# Patient Record
Sex: Female | Born: 1965 | Hispanic: No | Marital: Single | Smoking: Current every day smoker
Health system: Southern US, Community
[De-identification: ages and names within clinical notes are randomized; demographics above are authoritative.]

## PROBLEM LIST (undated history)

## (undated) DIAGNOSIS — J449 Chronic obstructive pulmonary disease, unspecified: Secondary | ICD-10-CM

## (undated) DIAGNOSIS — M1990 Unspecified osteoarthritis, unspecified site: Secondary | ICD-10-CM

---

## 2006-11-17 ENCOUNTER — Emergency Department (HOSPITAL_COMMUNITY): Admission: EM | Admit: 2006-11-17 | Discharge: 2006-11-17 | Payer: Self-pay | Admitting: Emergency Medicine

## 2014-07-25 ENCOUNTER — Encounter (HOSPITAL_COMMUNITY): Payer: Self-pay | Admitting: Emergency Medicine

## 2014-07-25 ENCOUNTER — Inpatient Hospital Stay (HOSPITAL_COMMUNITY)
Admission: EM | Admit: 2014-07-25 | Discharge: 2014-07-26 | DRG: 193 | Disposition: A | Discharge status: Home / Self Care | Payer: Self-pay | Attending: Internal Medicine | Admitting: Internal Medicine

## 2014-07-25 ENCOUNTER — Emergency Department (HOSPITAL_COMMUNITY): Payer: Self-pay

## 2014-07-25 DIAGNOSIS — Z7951 Long term (current) use of inhaled steroids: Secondary | ICD-10-CM

## 2014-07-25 DIAGNOSIS — J9601 Acute respiratory failure with hypoxia: Secondary | ICD-10-CM | POA: Diagnosis present

## 2014-07-25 DIAGNOSIS — J441 Chronic obstructive pulmonary disease with (acute) exacerbation: Secondary | ICD-10-CM | POA: Diagnosis present

## 2014-07-25 DIAGNOSIS — R0902 Hypoxemia: Secondary | ICD-10-CM

## 2014-07-25 DIAGNOSIS — F17210 Nicotine dependence, cigarettes, uncomplicated: Secondary | ICD-10-CM | POA: Diagnosis present

## 2014-07-25 DIAGNOSIS — J449 Chronic obstructive pulmonary disease, unspecified: Secondary | ICD-10-CM | POA: Diagnosis present

## 2014-07-25 DIAGNOSIS — E876 Hypokalemia: Secondary | ICD-10-CM | POA: Diagnosis present

## 2014-07-25 DIAGNOSIS — D72829 Elevated white blood cell count, unspecified: Secondary | ICD-10-CM | POA: Diagnosis present

## 2014-07-25 DIAGNOSIS — J189 Pneumonia, unspecified organism: Principal | ICD-10-CM | POA: Diagnosis present

## 2014-07-25 HISTORY — DX: Unspecified osteoarthritis, unspecified site: M19.90

## 2014-07-25 HISTORY — DX: Chronic obstructive pulmonary disease, unspecified: J44.9

## 2014-07-25 LAB — CBC
HEMATOCRIT: 38.2 % (ref 36.0–46.0)
Hemoglobin: 11.9 g/dL — ABNORMAL LOW (ref 12.0–15.0)
MCH: 26.2 pg (ref 26.0–34.0)
MCHC: 31.2 g/dL (ref 30.0–36.0)
MCV: 84.1 fL (ref 78.0–100.0)
Platelets: 358 10*3/uL (ref 150–400)
RBC: 4.54 MIL/uL (ref 3.87–5.11)
RDW: 16.4 % — ABNORMAL HIGH (ref 11.5–15.5)
WBC: 13.8 10*3/uL — ABNORMAL HIGH (ref 4.0–10.5)

## 2014-07-25 LAB — BASIC METABOLIC PANEL
Anion gap: 12 (ref 5–15)
BUN: 12 mg/dL (ref 6–23)
CO2: 24 meq/L (ref 19–32)
Calcium: 8.5 mg/dL (ref 8.4–10.5)
Chloride: 103 mEq/L (ref 96–112)
Creatinine, Ser: 0.55 mg/dL (ref 0.50–1.10)
GFR calc Af Amer: >90 mL/min (ref >90)
GFR calc non Af Amer: >90 mL/min (ref >90)
GLUCOSE: 100 mg/dL — AB (ref 70–99)
POTASSIUM: 3.5 meq/L — AB (ref 3.7–5.3)
SODIUM: 139 meq/L (ref 137–147)

## 2014-07-25 LAB — I-STAT TROPONIN, ED: Troponin i, poc: 0 ng/mL (ref 0.00–0.08)

## 2014-07-25 MED ORDER — INFLUENZA VAC SPLIT QUAD 0.5 ML IM SUSY
0.5000 mL | PREFILLED_SYRINGE | INTRAMUSCULAR | Status: AC
  Administered 2014-07-26: 0.5 mL via INTRAMUSCULAR
  Filled 2014-07-25 (×2): qty 0.5

## 2014-07-25 MED ORDER — GUAIFENESIN 100 MG/5ML PO SOLN
200.0000 mg | ORAL | Status: DC | PRN
  Administered 2014-07-25: 200 mg via ORAL
  Filled 2014-07-25: qty 10

## 2014-07-25 MED ORDER — ONDANSETRON HCL 4 MG/2ML IJ SOLN: 4.0000 mg | Freq: Four times a day (QID) | INTRAMUSCULAR | Status: DC | PRN

## 2014-07-25 MED ORDER — PNEUMOCOCCAL VAC POLYVALENT 25 MCG/0.5ML IJ INJ
0.5000 mL | INJECTION | INTRAMUSCULAR | Status: AC
  Administered 2014-07-26: 0.5 mL via INTRAMUSCULAR
  Filled 2014-07-25 (×2): qty 0.5

## 2014-07-25 MED ORDER — PREDNISONE 20 MG PO TABS
60.0000 mg | ORAL_TABLET | Freq: Once | ORAL | Status: AC
  Administered 2014-07-25: 60 mg via ORAL
  Filled 2014-07-25: qty 3

## 2014-07-25 MED ORDER — ONDANSETRON HCL 4 MG PO TABS: 4.0000 mg | ORAL_TABLET | Freq: Four times a day (QID) | ORAL | Status: DC | PRN

## 2014-07-25 MED ORDER — IPRATROPIUM-ALBUTEROL 0.5-2.5 (3) MG/3ML IN SOLN
3.0000 mL | Freq: Once | RESPIRATORY_TRACT | Status: AC
  Administered 2014-07-25: 3 mL via RESPIRATORY_TRACT
  Filled 2014-07-25: qty 3

## 2014-07-25 MED ORDER — SODIUM CHLORIDE 0.9 % IV SOLN
INTRAVENOUS | Status: DC
  Administered 2014-07-25: 21:00:00 via INTRAVENOUS

## 2014-07-25 MED ORDER — LEVOFLOXACIN IN D5W 750 MG/150ML IV SOLN
750.0000 mg | INTRAVENOUS | Status: DC
  Administered 2014-07-25: 750 mg via INTRAVENOUS
  Filled 2014-07-25: qty 150

## 2014-07-25 MED ORDER — TRAMADOL HCL 50 MG PO TABS
100.0000 mg | ORAL_TABLET | Freq: Three times a day (TID) | ORAL | Status: DC | PRN
  Administered 2014-07-26: 100 mg via ORAL
  Filled 2014-07-25: qty 2

## 2014-07-25 MED ORDER — POTASSIUM CHLORIDE CRYS ER 20 MEQ PO TBCR
40.0000 meq | EXTENDED_RELEASE_TABLET | Freq: Once | ORAL | Status: AC
  Administered 2014-07-25: 40 meq via ORAL
  Filled 2014-07-25: qty 2

## 2014-07-25 MED ORDER — IPRATROPIUM-ALBUTEROL 0.5-2.5 (3) MG/3ML IN SOLN: 3.0000 mL | RESPIRATORY_TRACT | Status: DC | PRN

## 2014-07-25 MED ORDER — ACETAMINOPHEN 325 MG PO TABS: 650.0000 mg | ORAL_TABLET | Freq: Four times a day (QID) | ORAL | Status: DC | PRN

## 2014-07-25 MED ORDER — PREDNISONE 50 MG PO TABS
50.0000 mg | ORAL_TABLET | Freq: Every day | ORAL | Status: DC
  Administered 2014-07-26: 50 mg via ORAL
  Filled 2014-07-25 (×2): qty 1

## 2014-07-25 MED ORDER — ALPRAZOLAM 1 MG PO TABS
1.0000 mg | ORAL_TABLET | Freq: Three times a day (TID) | ORAL | Status: DC | PRN
  Administered 2014-07-26: 1 mg via ORAL
  Filled 2014-07-25: qty 1

## 2014-07-25 MED ORDER — ACETAMINOPHEN 650 MG RE SUPP: 650.0000 mg | Freq: Four times a day (QID) | RECTAL | Status: DC | PRN

## 2014-07-25 MED ORDER — IPRATROPIUM-ALBUTEROL 0.5-2.5 (3) MG/3ML IN SOLN
3.0000 mL | RESPIRATORY_TRACT | Status: DC
  Administered 2014-07-25: 3 mL via RESPIRATORY_TRACT
  Filled 2014-07-25 (×2): qty 3

## 2014-07-25 MED ORDER — SODIUM CHLORIDE 0.9 % IJ SOLN
3.0000 mL | Freq: Two times a day (BID) | INTRAMUSCULAR | Status: DC
Start: 2014-07-25 — End: 2014-07-26

## 2014-07-25 NOTE — Progress Notes (Signed)
  CARE MANAGEMENT ED NOTE 07/25/2014  Patient:  [name] Bishop,[name]   Account Number:  [id number]  Date Initiated:  07/25/2014  Documentation initiated by:  Radford PaxFERRERO,Manuella Blackson  Subjective/Objective Assessment:   Patient presents to Ed with shortness of breath, cough with sputum     Subjective/Objective Assessment Detail:   Patient with pmhx of COPD.     Action/Plan:   Action/Plan Detail:   Anticipated DC Date:       Status Recommendation to Physician:   Result of Recommendation:    Other ED Services  Consult Working Plan    DC Planning Services  Other  PCP issues    Choice offered to / List presented to:            Status of service:  Completed, signed off  ED Comments:   ED Comments Detail:  EDCM spoke to patient at bedside.  Patient reports she has just mocved here form [place].  Patient confirms she does nto have a pcp or [place]. EDCM spoke to patient at bedside.  EDCM [facility], informed patient of services there and walk in times.  EDCM also provided patient with list of pcps who accept self pay patients, list of discount pharmacies and websites needymeds.org and GoodRX.com for medication assistance, phone number to inquire about the orange card, phone number to inquire about Medic[employer]ad, phone number to inquire about the Affordable Care Act, financial resources in the community such as local churches, [employer], urban ministries, and dental assistance for uninsured patients.  Patient thankfulf or resources.  No further EDCM needs at this time.

## 2014-07-25 NOTE — ED Provider Notes (Signed)
CSN: [id number]     Arrival date & time 07/25/14  1230 History   First MD Initiated Contact with Patient 07/25/14 1325     Chief Complaint  Patient presents with  . Shortness of Breath  . Medication Refill     (Consider location/radiation/quality/duration/timing/severity/associated sxs/prior Treatment) The history is provided by the patient. No language interpreter was used.  [name] Bishop is a 48 year old female with past medical history of COPD presenting to the ED with shortness of breath that has been ongoing for approximately one month with worsening symptoms of the past couple of days. Stated that the shortness of breath is worse in the morning and stated that she has been having to use her albuterol more frequently. Reported that she's been having increased cough with sputum production of a yellow, greenish discoloration. Reported that she was having fever on Friday, 101F orally. Patient reported that she has not been taking her medications for approximately one month secondary to being unable to afford the medication. Patient reported that she's noticed increased shortness of breath with exertion, reported that she has been unable to walk from one end to the other without feeling short of breath. Stated that she's been experiencing chest pain localized left-sided chest without radiation described as a squeezing sensation intermittently only experience with exertion. Stated that she does smoke approximately a half pack cigarettes per day. Reported that [place] recently [place] from [place] and does not have a primary care provider [place] in [place]. Denied neck pain, neck stiffness, abdominal pain, nausea, vomiting with diarrhea, syncope and blurred vision, sudden loss of vision, numbness, tingling, weakness. PCP none   Past Medical History  Diagnosis Date  . COPD (chronic obstructive pulmonary disease)   . Arthritis    Past Surgical History  Procedure Laterality Date  . Cesarean  section     No family history on file. History  Substance Use Topics  . Smoking status: Current Every Day Smoker    Types: Cigarettes  . Smokeless tobacco: Not on file  . Alcohol Use: No   OB History    No data available     Review of Systems  Constitutional: Negative for fever and chills.  Eyes: Negative for visual disturbance.  Respiratory: Positive for cough and shortness of breath. Negative for chest tightness.   Cardiovascular: Positive for chest pain.  Neurological: Negative for dizziness, weakness and headaches.      Allergies  Review of patient's allergies indicates no known allergies.  Home Medications   Prior to Admission medications   Medication Sig Start Date End Date Taking? Authorizing Provider  ALPRAZolam Prudy Feeler(XANAX) 1 MG tablet Take 1 mg by mouth 3 (three) times daily as needed for anxiety (anger management).   Yes Historical Provider, MD  budesonide-formoterol (SYMBICORT) 160-4.5 MCG/ACT inhaler Inhale 2 puffs into the lungs 2 (two) times daily.   Yes Historical Provider, MD  traMADol (ULTRAM) 50 MG tablet Take 100 mg by mouth 3 (three) times daily as needed for severe pain.  06/21/14  Yes Historical Provider, MD   BP 131/61 mmHg  Pulse 121  Temp(Src) 98.4 F (36.9 C) (Oral)  Resp 16  SpO2 84%  LMP 07/18/2014 Physical Exam  Constitutional: She is oriented to person, place, and time. She appears well-developed and well-nourished. No distress.  HENT:  Head: Normocephalic and atraumatic.  Eyes: Conjunctivae and EOM are normal. Pupils are equal, round, and reactive to light. Right eye exhibits no discharge. Left eye exhibits no discharge.  Neck:  Normal range of motion. Neck supple. No tracheal deviation present.  Cardiovascular: Normal rate, regular rhythm and normal heart sounds.  Exam reveals no friction rub.   No murmur heard. Pulmonary/Chest: Effort normal. No respiratory distress. She has no wheezes. She has rhonchi in the right lower field and the left  lower field. She has no rales.  Decreased breath sounds upper lower lobes bilaterally Expiratory wheezes Rhonchi to the lower lobes bilaterally Negative use of accessory muscles  Musculoskeletal: Normal range of motion.  Lymphadenopathy:    She has no cervical adenopathy.  Neurological: She is alert and oriented to person, place, and time. No cranial nerve deficit. Coordination normal.  Skin: Skin is warm and dry. No rash noted. She is not diaphoretic. No erythema.  Psychiatric: She has a normal mood and affect. Her behavior is normal. Thought content normal.  Nursing note and vitals reviewed.   ED Course  Procedures (including critical care time)  Results for orders placed or performed during the hospital encounter of 07/25/14  Basic metabolic panel    (if pt has PMH of COPD)  Result Value Ref Range   Sodium 139 137 - 147 mEq/L   Potassium 3.5 (L) 3.7 - 5.3 mEq/L   Chloride 103 96 - 112 mEq/L   CO2 24 19 - 32 mEq/L   Glucose, Bld 100 (H) 70 - 99 mg/dL   BUN 12 6 - 23 mg/dL   Creatinine, Ser 4.090.55 0.50 - 1.10 mg/dL   Calcium 8.5 8.4 - 81.110.5 mg/dL   GFR calc non Af Amer >90 >90 mL/min   GFR calc Af Amer >90 >90 mL/min   Anion gap 12 5 - 15  CBC     (if pt has PMH of COPD)  Result Value Ref Range   WBC 13.8 (H) 4.0 - 10.5 K/uL   RBC 4.54 3.87 - 5.11 MIL/uL   Hemoglobin 11.9 (L) 12.0 - 15.0 g/dL   HCT 91.438.2 78.236.0 - 95.646.0 %   MCV 84.1 78.0 - 100.0 fL   MCH 26.2 26.0 - 34.0 pg   MCHC 31.2 30.0 - 36.0 g/dL   RDW 21.316.4 (H) 08.611.5 - 57.815.5 %   Platelets 358 150 - 400 K/uL  I-stat troponin, ED (if patient has history of COPD)  Result Value Ref Range   Troponin i, poc 0.00 0.00 - 0.08 ng/mL   Comment 3            Labs Review Labs Reviewed  BASIC METABOLIC PANEL - Abnormal; Notable for the following:    Potassium 3.5 (*)    Glucose, Bld 100 (*)    All other components within normal limits  CBC - Abnormal; Notable for the following:    WBC 13.8 (*)    Hemoglobin 11.9 (*)    RDW  16.4 (*)    All other components within normal limits  I-STAT TROPOININ, ED  POC URINE PREG, ED    Imaging Review Dg Chest 2 View (if Patient Has Fever And/or Copd)  07/25/2014   CLINICAL DATA:  Short of breath  EXAM: CHEST  2 VIEW  COMPARISON:  None.  FINDINGS: The heart size and mediastinal contours are within normal limits. Both lungs are clear. The visualized skeletal structures are unremarkable.  IMPRESSION: No active cardiopulmonary disease.   Electronically Signed   By: Marlan Palauharles  Clark M.D.   On: 07/25/2014 14:38     EKG Interpretation   Date/Time:  Tuesday July 25 2014 12:50:30 EST Ventricular Rate:  88  PR Interval:  153 QRS Duration: 75 QT Interval:  366 QTC Calculation: 443 R Axis:   81 Text Interpretation:  Sinus rhythm Low voltage, precordial leads Baseline  wander in lead(s) V2 V3 V6 No old tracing to compare Confirmed by [facility]   MD, MARTHA [id number]) on 07/25/2014 2:53:13 PM       4:13 PM Patient ambulated. Pulse ox on room air 84%.   4:30 PM This provider discussed case in great detail with Dr. Ashok Pall, [facility] - discussed case, labs, imaging, vitals, ED course in great detail. Patient to be admitted to telemetry floor.  MDM   Final diagnoses:  COPD exacerbation  Hypoxia    Medications  ipratropium-albuterol (DUONEB) 0.5-2.5 (3) MG/3ML nebulizer solution 3 mL (3 mLs Nebulization Given 07/25/14 1310)  ipratropium-albuterol (DUONEB) 0.5-2.5 (3) MG/3ML nebulizer solution 3 mL (3 mLs Nebulization Given 07/25/14 1523)  predniSONE (DELTASONE) tablet 60 mg (60 mg Oral Given 07/25/14 1520)   Filed Vitals:   07/25/14 1239 07/25/14 1606 07/25/14 1617  BP: 142/73 131/61   Pulse: 92 100 121  Temp: 98.1 F (36.7 C) 98.4 F (36.9 C)   TempSrc: Oral Oral   Resp: 18 16   SpO2: 98% 100% 84%   EKG noted sinus rhythm with a heart rate of 88 bpm. I-STAT troponin negative elevation. CBC noted elevated white blood cell count 13.8. BMP unremarkable. Chest x-ray  negative for acute cardiopulmonary disease. Patient presenting to the ED with a COPD exacerbation. Patient given nebulizer treatments and steroids. During ambulation patient's pulse ox dropped to 84% on room air with a heart rate of 122 bpm. Patient appeared to be out of breath with walking down the hallway. Patient has history of COPD and has not been taking her medications secondary to monetary issues, patient continues to smoke cigarettes approximately half pack per day. Patient to be admitted to the hospital regarding hypoxia and COPD exacerbation. Discussed plan for admission with patient agrees to plan of care. Discussed case in great detail with Dr. Sela Hilding to be admitted to telemetry floor. Patient stable for transfer.  Raymon Mutton, PA-C 07/25/14 1639  Ethelda Chick, MD 07/25/14 [phone]

## 2014-07-25 NOTE — ED Notes (Signed)
Made [facility] aware that 20min bed timer has started. Spoke with [employer] bc charge RN was unable at this time.

## 2014-07-25 NOTE — ED Notes (Signed)
Patient stated she doesn't have to use the restroom. Patient also stated that she knows that she is not pregnant.

## 2014-07-25 NOTE — H&P (Signed)
[facility] History and Physical  [name] BJY:[id number] DOB: 1965-09-05 DOA: 07/25/2014  Referring physician: ER physician PCP: No primary care provider on file.   Chief Complaint: shortness of breath, fever, cough   HPI:  48 year old female with history of COPD, smoker, not compliant with her inhalers at home due to limited financial resources. Pt reported progressive shortness of breath over past 1 week prior to this admission with associated cough productive of yellowish sputum, fever of 101 F at home. She did not have nebulizers as home. No chest pain other than when she is coughing. No abdominal pain, nausea or vomiting. No reports of blood in stool or urine. No lightheadedness or loss of consciousness.   In ED, BP was 128/59, HR 92-121, RR 16, T max 98.4 F and oxygen saturation of 84% on room air. It has improved to 100% with 2 L [place] oxygen support. CXR showed no acute cardiopulmonary disease. Blood work showed WBC count 13.8, hemoglobin was 11.9, potassium 3.5. Pt received nebulizer treatment in ED but continued to be short of breath and needed further management and admission.   Assessment & Plan    Principal Problem:   Acute respiratory failure with hypoxia / acute COPD exacerbation  - hypoxia likely due to combination of COPD exacerbation of pneumonia - oxygen saturation was 84% on room air on admission but improved to 100% on 2 L [place] oxygen support  - pt on nasal canula oxygen support to keep O2 sats above 90% - COPD gold alert ordered - duoneb every 4 hours scheduled and as needed for shortness of breath of wheezing  - started on prednisone 50 mg daily  - admission to telemetry due to an episode of tachycardia in 120's range. Active Problems:   CAP (community acquired pneumonia) / Leukocytosis - possible pneumonia since pt had fever, productive cough - pneumonia order set in place - follow up blood culture results, resp culture results, legionella, strep  penumo, influenza  - Levaquin for pneumonia   Hypokalemia - likely due to nebulizer treatments - supplemented   DVT prophylaxis:  - SCD's bilaterally   Radiological Exams on Admission: Dg Chest 2 View (if Patient Has Fever And/or Copd) 07/25/2014   No active cardiopulmonary disease.      EKG: sinus rhythm  Code Status: Full Family Communication: Plan of care discussed with the patient  Disposition Plan: Admit for further evaluation  Manson PasseyEVINE, Amit Leece, MD  [facility] Pager [phone]  Review of Systems:  Constitutional: positive for fever, chills and malaise/fatigue. Negative for diaphoresis.  HENT: Negative for hearing loss, ear pain, nosebleeds, congestion, sore throat, neck pain, tinnitus and ear discharge.   Eyes: Negative for blurred vision, double vision, photophobia, pain, discharge and redness.  Respiratory: per HPI.   Cardiovascular: Negative for chest pain, palpitations, orthopnea, claudication and leg swelling.  Gastrointestinal: Negative for nausea, vomiting and abdominal pain. Negative for heartburn, constipation, blood in stool and melena.  Genitourinary: Negative for dysuria, urgency, frequency, hematuria and flank pain.  Musculoskeletal: Negative for myalgias, back pain, joint pain and falls.  Skin: Negative for itching and rash.  Neurological: Negative for dizziness and weakness. Negative for tingling, tremors, sensory change, speech change, focal weakness, loss of consciousness and headaches.  Endo/Heme/Allergies: Negative for environmental allergies and polydipsia. Does not bruise/bleed easily.  Psychiatric/Behavioral: Negative for suicidal ideas. The patient is not nervous/anxious.      Past Medical History  Diagnosis Date  . COPD (chronic obstructive pulmonary disease)   .  Arthritis    Past Surgical History  Procedure Laterality Date  . Cesarean section     Social History:  reports that she has been smoking Cigarettes.  She has been smoking about  0.00 packs per day. She does not have any smokeless tobacco history on file. She reports that she does not drink alcohol. Her drug history is not on file.  No Known Allergies  Family History: HTN in family    Prior to Admission medications   Medication Sig Start Date End Date Taking? Authorizing Provider  ALPRAZolam Prudy Feeler(XANAX) 1 MG tablet Take 1 mg by mouth 3 (three) times daily as needed for anxiety (anger management).   Yes Historical Provider, MD  budesonide-formoterol (SYMBICORT) 160-4.5 MCG/ACT inhaler Inhale 2 puffs into the lungs 2 (two) times daily.   Yes Historical Provider, MD  traMADol (ULTRAM) 50 MG tablet Take 100 mg by mouth 3 (three) times daily as needed for severe pain.  06/21/14  Yes Historical Provider, MD   Physical Exam: Filed Vitals:   07/25/14 1239 07/25/14 1606 07/25/14 1617 07/25/14 1752  BP: 142/73 131/61  128/59  Pulse: 92 100 121 92  Temp: 98.1 F (36.7 C) 98.4 F (36.9 C)  98.1 F (36.7 C)  TempSrc: Oral Oral  Oral  Resp: 18 16    Height:    5' 1.5" (1.562 m)  Weight:    99.168 kg (218 lb 10 oz)  SpO2: 98% 100% 84% 100%    Physical Exam  Constitutional: Appears well-developed and well-nourished. No distress.  HENT: Normocephalic. No tonsillar erythema or exudates Eyes: Conjunctivae and EOM are normal. PERRLA, no scleral icterus.  Neck: Normal ROM. Neck supple. No JVD. No tracheal deviation. No thyromegaly.  CVS: regular rhythm, S1/S2 +, no murmurs, no gallops, no carotid bruit.  Pulmonary: wheezing in upper and mid lung lobes, rhonchi bilaterally   Abdominal: Soft. BS +,  no distension, tenderness, rebound or guarding.  Musculoskeletal: Normal range of motion. No edema and no tenderness.  Lymphadenopathy: No lymphadenopathy noted, cervical, inguinal. Neuro: Alert. Normal reflexes, muscle tone coordination. No focal neurologic deficits. Skin: Skin is warm and dry. No rash noted. Not diaphoretic. No erythema. No pallor.  Psychiatric: Normal mood and  affect. Behavior, judgment, thought content normal.   Labs on Admission:  Basic Metabolic Panel:  Recent Labs Lab 07/25/14 1305  NA 139  K 3.5*  CL 103  CO2 24  GLUCOSE 100*  BUN 12  CREATININE 0.55  CALCIUM 8.5   Liver Function Tests: No results for input(s): AST, ALT, ALKPHOS, BILITOT, PROT, ALBUMIN in the last 168 hours. No results for input(s): LIPASE, AMYLASE in the last 168 hours. No results for input(s): AMMONIA in the last 168 hours. CBC:  Recent Labs Lab 07/25/14 1305  WBC 13.8*  HGB 11.9*  HCT 38.2  MCV 84.1  PLT 358   Cardiac Enzymes: No results for input(s): CKTOTAL, CKMB, CKMBINDEX, TROPONINI in the last 168 hours. BNP: Invalid input(s): POCBNP CBG: No results for input(s): GLUCAP in the last 168 hours.  If 7PM-7AM, please contact night-coverage [email or web address] Password [email or web address] 07/25/2014, 7:57 PM

## 2014-07-25 NOTE — ED Notes (Signed)
Pt states that she recently moved here and hasnt got a PCP to see for her medications or her SHOB.  Pt c/o SHOB for month. Pt has COPD.

## 2014-07-25 NOTE — ED Notes (Signed)
Bed: [id number] Expected date:  Expected time:  Means of arrival:  Comments: TR 2

## 2014-07-26 DIAGNOSIS — J9601 Acute respiratory failure with hypoxia: Secondary | ICD-10-CM

## 2014-07-26 LAB — CBC
HCT: 37.7 % (ref 36.0–46.0)
Hemoglobin: 11.7 g/dL — ABNORMAL LOW (ref 12.0–15.0)
MCH: 26.2 pg (ref 26.0–34.0)
MCHC: 31 g/dL (ref 30.0–36.0)
MCV: 84.3 fL (ref 78.0–100.0)
Platelets: 371 10*3/uL (ref 150–400)
RBC: 4.47 MIL/uL (ref 3.87–5.11)
RDW: 16.3 % — AB (ref 11.5–15.5)
WBC: 11.9 10*3/uL — ABNORMAL HIGH (ref 4.0–10.5)

## 2014-07-26 LAB — D-DIMER, QUANTITATIVE (NOT AT ARMC)

## 2014-07-26 LAB — COMPREHENSIVE METABOLIC PANEL
ALK PHOS: 80 U/L (ref 39–117)
ALT: 9 U/L (ref 0–35)
ANION GAP: 11 (ref 5–15)
AST: 10 U/L (ref 0–37)
Albumin: 3.4 g/dL — ABNORMAL LOW (ref 3.5–5.2)
BUN: 9 mg/dL (ref 6–23)
CHLORIDE: 103 meq/L (ref 96–112)
CO2: 23 mEq/L (ref 19–32)
Calcium: 8.6 mg/dL (ref 8.4–10.5)
Creatinine, Ser: 0.49 mg/dL — ABNORMAL LOW (ref 0.50–1.10)
GFR calc non Af Amer: >90 mL/min (ref >90)
GLUCOSE: 119 mg/dL — AB (ref 70–99)
POTASSIUM: 4.5 meq/L (ref 3.7–5.3)
Sodium: 137 mEq/L (ref 137–147)
Total Protein: 7.4 g/dL (ref 6.0–8.3)

## 2014-07-26 LAB — HIV ANTIBODY (ROUTINE TESTING W REFLEX): HIV: NONREACTIVE

## 2014-07-26 LAB — INFLUENZA PANEL BY PCR (TYPE A & B)
H1N1 flu by pcr: NOT DETECTED
INFLBPCR: NEGATIVE
Influenza A By PCR: NEGATIVE

## 2014-07-26 LAB — STREP PNEUMONIAE URINARY ANTIGEN: Strep Pneumo Urinary Antigen: NEGATIVE

## 2014-07-26 LAB — PROTIME-INR
INR: 1.09 (ref 0.00–1.49)
PROTHROMBIN TIME: 14.2 s (ref 11.6–15.2)

## 2014-07-26 LAB — APTT: aPTT: 31 seconds (ref 24–37)

## 2014-07-26 LAB — GLUCOSE, CAPILLARY: GLUCOSE-CAPILLARY: 132 mg/dL — AB (ref 70–99)

## 2014-07-26 MED ORDER — BUDESONIDE-FORMOTEROL FUMARATE 160-4.5 MCG/ACT IN AERO: 2.0000 | INHALATION_SPRAY | Freq: Two times a day (BID) | RESPIRATORY_TRACT | Status: AC

## 2014-07-26 MED ORDER — IPRATROPIUM-ALBUTEROL 0.5-2.5 (3) MG/3ML IN SOLN: 3.0000 mL | Freq: Four times a day (QID) | RESPIRATORY_TRACT | Status: DC | PRN

## 2014-07-26 MED ORDER — NICOTINE 21 MG/24HR TD PT24: 21.0000 mg | MEDICATED_PATCH | Freq: Every day | TRANSDERMAL | Status: AC

## 2014-07-26 MED ORDER — PREDNISONE 10 MG PO TABS: ORAL_TABLET | ORAL | Status: AC

## 2014-07-26 MED ORDER — ALBUTEROL SULFATE HFA 108 (90 BASE) MCG/ACT IN AERS: 2.0000 | INHALATION_SPRAY | Freq: Four times a day (QID) | RESPIRATORY_TRACT | Status: AC | PRN

## 2014-07-26 MED ORDER — GUAIFENESIN-DM 100-10 MG/5ML PO SYRP: 5.0000 mL | ORAL_SOLUTION | ORAL | Status: AC | PRN

## 2014-07-26 MED ORDER — IPRATROPIUM-ALBUTEROL 0.5-2.5 (3) MG/3ML IN SOLN
3.0000 mL | Freq: Three times a day (TID) | RESPIRATORY_TRACT | Status: DC
  Administered 2014-07-26: 3 mL via RESPIRATORY_TRACT
  Filled 2014-07-26: qty 3

## 2014-07-26 MED ORDER — LEVOFLOXACIN 750 MG PO TABS: 750.0000 mg | ORAL_TABLET | Freq: Every day | ORAL | Status: AC

## 2014-07-26 NOTE — Progress Notes (Addendum)
Nutrition Brief Note  Patient identified on the Malnutrition Screening Tool (MST) Report and Consult to Assess  Wt Readings from Last 15 Encounters:  07/26/14 217 lb 4.8 oz (98.567 kg)    Body mass index is 40.4 kg/(m^2). Patient meets criteria for Morbid Obesity based on current BMI.   Current diet order is Heart Healthy, patient is consuming approximately 100% of meals at this time. Labs and medications reviewed.   Pt reported good appetite pta, normally eats one meal daily, usually late dinner at 9PM. Weight has remained stable. Noted a slight decrease in one week d/t shortness of breath, however this has since improved during admit and is now eating well. Endorsed hx of binging/purging eating disorder; however reported to be recovering with assistance from friends and family. Encouraged pt to consume small snacks vs skipping meals and to set weekly goals for self (eating breakfast, having dinner earlier in evening, etc). Pt verbalized understanding.   Pt to be discharged later today (12/09) No nutrition interventions warranted at this time. If nutrition issues arise, please consult RD.   [name] HugerSarah F Persis Graffius MS RD LDN Clinical Dietitian Pager:[phone]

## 2014-07-26 NOTE — Progress Notes (Signed)
Discharge information gone over with pt, all questions answered.  Prescriptions given to pt.  VSS.  Wheeled out by NT.

## 2014-07-26 NOTE — Plan of Care (Signed)
Problem: Consults Goal: COPD Patient Education (See Patient Education Module for education specifics.)  Outcome: Completed/Met Date Met:  07/26/14

## 2014-07-26 NOTE — Progress Notes (Signed)
Pt's o2 stats ranged from 95-100% while walking in the hall.  Pulse ranged from 110-120.  MD made aware, will continue to monitor.

## 2014-07-26 NOTE — Progress Notes (Signed)
CSW received consult for COPD Gold Protocol, though patient does not meet qualifications (only 1 admission within the past 6 months).   CSW signing off.   Rage Beever, LCSW [facility] Clinical Social Worker cell #: [phone]    

## 2014-07-26 NOTE — Discharge Summary (Addendum)
Physician Discharge Summary  [name] Bishop MRN: [id number] DOB/AGE: 01/17/1966 48 y.o.  PCP: No primary care provider on file.   Admit date: 07/25/2014 Discharge date: 07/26/2014  Discharge Diagnoses:  :   Acute respiratory failure with hypoxia Active Problems:   COPD exacerbation   CAP (community acquired pneumonia)   Leukocytosis   Hypokalemia   COPD (chronic obstructive pulmonary disease)   Follow-up recommendations  follow-up with PCP in 5-7 days     Medication List    TAKE these medications        albuterol 108 (90 BASE) MCG/ACT inhaler  Commonly known as:  PROVENTIL HFA;VENTOLIN HFA  Inhale 2 puffs into the lungs every 6 (six) hours as needed for wheezing or shortness of breath.     ALPRAZolam 1 MG tablet  Commonly known as:  XANAX  Take 1 mg by mouth 3 (three) times daily as needed for anxiety (anger management).     budesonide-formoterol 160-4.5 MCG/ACT inhaler  Commonly known as:  SYMBICORT  Inhale 2 puffs into the lungs 2 (two) times daily.     guaiFENesin-dextromethorphan 100-10 MG/5ML syrup  Commonly known as:  ROBITUSSIN DM  Take 5 mLs by mouth every 4 (four) hours as needed for cough.     levofloxacin 750 MG tablet  Commonly known as:  LEVAQUIN  Take 1 tablet (750 mg total) by mouth daily.     nicotine 21 mg/24hr patch  Commonly known as:  EQL NICOTINE  Place 1 patch (21 mg total) onto the skin daily.     predniSONE 10 MG tablet  Commonly known as:  DELTASONE  - Prednisone 5 tablets for 4 days  - 4 tablets for 4 days  - 3 tablets for 4 days  - 2 tablets for 4 days  - 1 tablet for 4 days  - DC     traMADol 50 MG tablet  Commonly known as:  ULTRAM  Take 100 mg by mouth 3 (three) times daily as needed for severe pain.        Discharge Condition:  Disposition: Final discharge disposition not confirmed   Consults:  None   Significant Diagnostic Studies: Dg Chest 2 View (if Patient Has Fever And/or Copd)  07/25/2014    CLINICAL DATA:  Short of breath  EXAM: CHEST  2 VIEW  COMPARISON:  None.  FINDINGS: The heart size and mediastinal contours are within normal limits. Both lungs are clear. The visualized skeletal structures are unremarkable.  IMPRESSION: No active cardiopulmonary disease.   Electronically Signed   By: Franchot Gallo M.D.   On: 07/25/2014 14:38      Microbiology: No results found for this or any previous visit (from the past 240 hour(s)).   Labs: Results for orders placed or performed during the hospital encounter of 07/25/14 (from the past 48 hour(s))  Basic metabolic panel    (if pt has PMH of COPD)     Status: Abnormal   Collection Time: 07/25/14  1:05 PM  Result Value Ref Range   Sodium 139 137 - 147 mEq/L   Potassium 3.5 (L) 3.7 - 5.3 mEq/L   Chloride 103 96 - 112 mEq/L   CO2 24 19 - 32 mEq/L   Glucose, Bld 100 (H) 70 - 99 mg/dL   BUN 12 6 - 23 mg/dL   Creatinine, Ser 0.55 0.50 - 1.10 mg/dL   Calcium 8.5 8.4 - 10.5 mg/dL   GFR calc non Af Amer >90 >90 mL/min   GFR  calc Af Amer >90 >90 mL/min    Comment: (NOTE) The eGFR has been calculated using the CKD EPI equation. This calculation has not been validated in all clinical situations. eGFR's persistently <90 mL/min signify possible Chronic Kidney Disease.    Anion gap 12 5 - 15  CBC     (if pt has PMH of COPD)     Status: Abnormal   Collection Time: 07/25/14  1:05 PM  Result Value Ref Range   WBC 13.8 (H) 4.0 - 10.5 K/uL   RBC 4.54 3.87 - 5.11 MIL/uL   Hemoglobin 11.9 (L) 12.0 - 15.0 g/dL   HCT 38.2 36.0 - 46.0 %   MCV 84.1 78.0 - 100.0 fL   MCH 26.2 26.0 - 34.0 pg   MCHC 31.2 30.0 - 36.0 g/dL   RDW 16.4 (H) 11.5 - 15.5 %   Platelets 358 150 - 400 K/uL  I-stat troponin, ED (if patient has history of COPD)     Status: None   Collection Time: 07/25/14  1:10 PM  Result Value Ref Range   Troponin i, poc 0.00 0.00 - 0.08 ng/mL   Comment 3            Comment: Due to the release kinetics of cTnI, a negative result within  the first hours of the onset of symptoms does not rule out myocardial infarction with certainty. If myocardial infarction is still suspected, repeat the test at appropriate intervals.   HIV antibody     Status: None   Collection Time: 07/25/14  8:00 PM  Result Value Ref Range   HIV 1&2 Ab, 4th Generation NONREACTIVE NONREACTIVE    Comment: (NOTE) A NONREACTIVE HIV Ag/Ab result does not exclude HIV infection since the time frame for seroconversion is variable. If acute HIV infection is suspected, a HIV-1 RNA Qualitative TMA test is recommended. HIV-1/2 Antibody Diff         Not indicated. HIV-1 RNA, Qual TMA           Not indicated. PLEASE NOTE: This information has been disclosed to you from records whose confidentiality may be protected by state law. If your state requires such protection, then the state law prohibits you from making any further disclosure of the information without the specific written consent of the person to whom it pertains, or as otherwise permitted by law. A general authorization for the release of medical or other information is NOT sufficient for this purpose. The performance of this assay has not been clinically validated in patients less than 8 years old. Performed at [facility]   Strep pneumoniae urinary antigen     Status: None   Collection Time: 07/25/14  9:06 PM  Result Value Ref Range   Strep Pneumo Urinary Antigen NEGATIVE NEGATIVE    Comment: PERFORMED AT [facility]        Infection due to S. pneumoniae cannot be absolutely ruled out since the antigen present may be below the detection limit of the test. Performed at [facility]   Comprehensive metabolic panel     Status: Abnormal   Collection Time: 07/26/14  5:18 AM  Result Value Ref Range   Sodium 137 137 - 147 mEq/L   Potassium 4.5 3.7 - 5.3 mEq/L    Comment: DELTA CHECK NOTED REPEATED TO VERIFY NO VISIBLE HEMOLYSIS    Chloride 103 96 - 112 mEq/L   CO2  23 19 - 32 mEq/L   Glucose, Bld 119 (H) 70 -  99 mg/dL   BUN 9 6 - 23 mg/dL   Creatinine, Ser 0.49 (L) 0.50 - 1.10 mg/dL   Calcium 8.6 8.4 - 10.5 mg/dL   Total Protein 7.4 6.0 - 8.3 g/dL   Albumin 3.4 (L) 3.5 - 5.2 g/dL   AST 10 0 - 37 U/L   ALT 9 0 - 35 U/L   Alkaline Phosphatase 80 39 - 117 U/L   Total Bilirubin <0.2 (L) 0.3 - 1.2 mg/dL   GFR calc non Af Amer >90 >90 mL/min   GFR calc Af Amer >90 >90 mL/min    Comment: (NOTE) The eGFR has been calculated using the CKD EPI equation. This calculation has not been validated in all clinical situations. eGFR's persistently <90 mL/min signify possible Chronic Kidney Disease.    Anion gap 11 5 - 15  CBC     Status: Abnormal   Collection Time: 07/26/14  5:18 AM  Result Value Ref Range   WBC 11.9 (H) 4.0 - 10.5 K/uL   RBC 4.47 3.87 - 5.11 MIL/uL   Hemoglobin 11.7 (L) 12.0 - 15.0 g/dL   HCT 37.7 36.0 - 46.0 %   MCV 84.3 78.0 - 100.0 fL   MCH 26.2 26.0 - 34.0 pg   MCHC 31.0 30.0 - 36.0 g/dL   RDW 16.3 (H) 11.5 - 15.5 %   Platelets 371 150 - 400 K/uL  Protime-INR     Status: None   Collection Time: 07/26/14  5:18 AM  Result Value Ref Range   Prothrombin Time 14.2 11.6 - 15.2 seconds   INR 1.09 0.00 - 1.49  APTT     Status: None   Collection Time: 07/26/14  5:18 AM  Result Value Ref Range   aPTT 31 24 - 37 seconds  Glucose, capillary     Status: Abnormal   Collection Time: 07/26/14  8:04 AM  Result Value Ref Range   Glucose-Capillary 132 (H) 70 - 99 mg/dL  D-dimer, quantitative     Status: None   Collection Time: 07/26/14 10:20 AM  Result Value Ref Range   D-Dimer, Quant <0.27 0.00 - 0.48 ug/mL-FEU    Comment:        AT THE INHOUSE ESTABLISHED CUTOFF VALUE OF 0.48 ug/mL FEU, THIS ASSAY HAS BEEN DOCUMENTED IN THE LITERATURE TO HAVE A SENSITIVITY AND NEGATIVE PREDICTIVE VALUE OF AT LEAST 98 TO 99%.  THE TEST RESULT SHOULD BE CORRELATED WITH AN ASSESSMENT OF THE CLINICAL PROBABILITY OF DVT / VTE.     49 year old female  with history of COPD, smoker, not compliant with her inhalers at home due to limited financial resources. Pt reported progressive shortness of breath over past 1 week prior to this admission with associated cough productive of yellowish sputum, fever of 101 F at home. She did not have nebulizers as home. No chest pain other than when she is coughing. No abdominal pain, nausea or vomiting. No reports of blood in stool or urine. No lightheadedness or loss of consciousness.   In ED, BP was 128/59, HR 92-121, RR 16, T max 98.4 F and oxygen saturation of 84% on room air. It has improved to 100% with 2 L [place] oxygen support. CXR showed no acute cardiopulmonary disease. Blood work showed WBC count 13.8, hemoglobin was 11.9, potassium 3.5. Pt received nebulizer treatment in ED but continued to be short of breath and needed further management and admission.   Assessment & Plan    Principal Problem:  Acute respiratory failure  with hypoxia / acute COPD exacerbation  - hypoxia likely due to combination of COPD exacerbation of pneumonia - oxygen saturation was 84% on room air on admission but improved to 100% on 2 L [place] oxygen support  99% on room air this morning, d-dimer negative, troponin negative 1 Improved with- duoneb every 4 hours scheduled and as needed for shortness of breath of wheezing  - started on prednisone 50 mg daily  Patient being transitioned to by mouth levofloxacin, prednisone taper and has been provided printed prescriptions for Symbicort and albuterol. Appointment for PCP follow-up has been provided    CAP (community acquired pneumonia) / Leukocytosis - possible pneumonia since pt had fever, productive cough - pneumonia order set in place - follow up blood culture results, resp culture results, legionella, strep penumo negative, - Levaquin for pneumonia, continue for 5 days    Hypokalemia - likely due to nebulizer treatments Repleted    Discharge Exam:  Blood pressure  112/63, pulse 72, temperature 97.9 F (36.6 C), temperature source Oral, resp. rate 20, height 5' 1.5" (1.562 m), weight 98.567 kg (217 lb 4.8 oz), last menstrual period 07/18/2014, SpO2 99 %.  HENT: Normocephalic. No tonsillar erythema or exudates Eyes: Conjunctivae and EOM are normal. PERRLA, no scleral icterus.  Neck: Normal ROM. Neck supple. No JVD. No tracheal deviation. No thyromegaly.  CVS: regular rhythm, S1/S2 +, no murmurs, no gallops, no carotid bruit.  Pulmonary: wheezing in upper and mid lung lobes, rhonchi bilaterally  Abdominal: Soft. BS +, no distension, tenderness, rebound or guarding.  Musculoskeletal: Normal range of motion. No edema and no tenderness.  Lymphadenopathy: No lymphadenopathy noted, cervical, inguinal. Neuro: Alert. Normal reflexes, muscle tone coordination. No focal neurologic deficits. Skin: Skin is warm and dry. No rash noted. Not diaphoretic. No erythema. No pallor.  Psychiatric: Normal mood and affect. Behavior, judgment, thought content normal.       Discharge Instructions    Diet - low sodium heart healthy    Complete by:  As directed      Diet - low sodium heart healthy    Complete by:  As directed      Increase activity slowly    Complete by:  As directed      Increase activity slowly    Complete by:  As directed              Signed: Jensyn Shave 07/26/2014, 11:22 AM

## 2014-07-26 NOTE — Progress Notes (Signed)
Pt was called on [phone] to give encourage to [facility].

## 2014-07-26 NOTE — Evaluation (Signed)
Occupational Therapy Evaluation Patient Details Name: [name] SchoolMaryhelen Bishop MRN: [id number] DOB: 1966-03-18 Today's Date: 07/26/2014    History of Present Illness 48 year old female with history of COPD, smoker, not compliant with her inhalers at home due to limited financial resources. Pt reported progressive shortness of breath over past 1 week prior to this admission with associated cough productive of yellowish sputum, fever of 101 F at home. She did not have nebulizers as home. No chest pain other than when she is coughing. No abdominal pain, nausea or vomiting. No reports of blood in stool or urine. No lightheadedness or loss of consciousness.    Clinical Impression   Pt educated in energy conservation techniques with ADL activity. Pt feels she is back to baseline. No further OT needed    Follow Up Recommendations  No OT follow up    Equipment Recommendations       Recommendations for Other Services       Precautions / Restrictions Precautions Precautions: None      Mobility Bed Mobility Overal bed mobility: Independent                Transfers Overall transfer level: Independent                    Balance Overall balance assessment: Independent                                          ADL Overall ADL's : At baseline                                             Vision                     Perception     Praxis      Pertinent Vitals/Pain Pain Assessment: No/denies pain     Hand Dominance     Extremity/Trunk Assessment Upper Extremity Assessment Upper Extremity Assessment: Overall WFL for tasks assessed           Communication Communication Communication: No difficulties   Cognition Arousal/Alertness: Awake/alert Behavior During Therapy: WFL for tasks assessed/performed Overall Cognitive Status: Within Functional Limits for tasks assessed                     General Comments    educated on importance of not smoking    Exercises        Prior Functioning/Environment Level of Independence: Independent             OT Diagnosis: Generalized weakness                       Co-evaluation              End of Session Nurse Communication: Mobility status  Activity Tolerance: Patient tolerated treatment well Patient left: in bed;with call bell/phone within reach;with family/visitor present   Time: 1246-1300 OT Time Calculation (min): 14 min Charges:  OT General Charges $OT Visit: 1 Procedure OT Evaluation $Initial OT Evaluation Tier I: 1 Procedure OT Treatments $Self Care/Home Management : 8-22 mins G-Codes:    [name] CrowEDDING, [name] Bishop D 07/26/2014, 1:00 PM

## 2014-07-27 LAB — LEGIONELLA ANTIGEN, URINE

## 2014-07-27 NOTE — Progress Notes (Signed)
CARE MANAGEMENT NOTE 07/27/2014  Patient:  [name] Bishop Bishop,[name] Bishop   Account Number:  [id number]  Date Initiated:  07/26/2014  Documentation initiated by:  [name] Bishop Bishop [name] Bishop Bishop  Subjective/Objective Assessment:   pt admitted with fever and SOB     Action/Plan:   from home   Anticipated DC Date:  07/27/2014   Anticipated DC Plan:  HOME/SELF CARE      DC Planning Services  CM consult      Choice offered to / List presented to:             Status of service:  In process, will continue to follow Medicare Important Message given?   (If response is "NO", the following Medicare IM given date fields will be blank) Date Medicare IM given:   Medicare IM given by:   Date Additional Medicare IM given:   Additional Medicare IM given by:    Discharge Disposition:    Per UR Regulation:  Reviewed for med. necessity/level of care/duration of stay  If discussed at Long Length of Stay Meetings, dates discussed:    Comments:  07/27/14 MMcGibboney, RN, BSN After calling several time to husband's cell. This CM sent a text message to husband cell for [facility] to [facility] at [phone].  07/26/14 MMcGibboney, RN, BSN Pt was called on [phone] to give encourage to [facility].  07/26/14 MMcGibboney, RN, BSN Chart reviewed.

## 2014-08-01 LAB — CULTURE, BLOOD (ROUTINE X 2)
CULTURE: NO GROWTH
CULTURE: NO GROWTH

## 2016-01-22 IMAGING — CR DG CHEST 2V
2 series · 2 of 2 positions shown · non-contrast
Comparison: None.

CLINICAL DATA: Short of breath

EXAM:
CHEST  2 VIEW

[w chest pa]
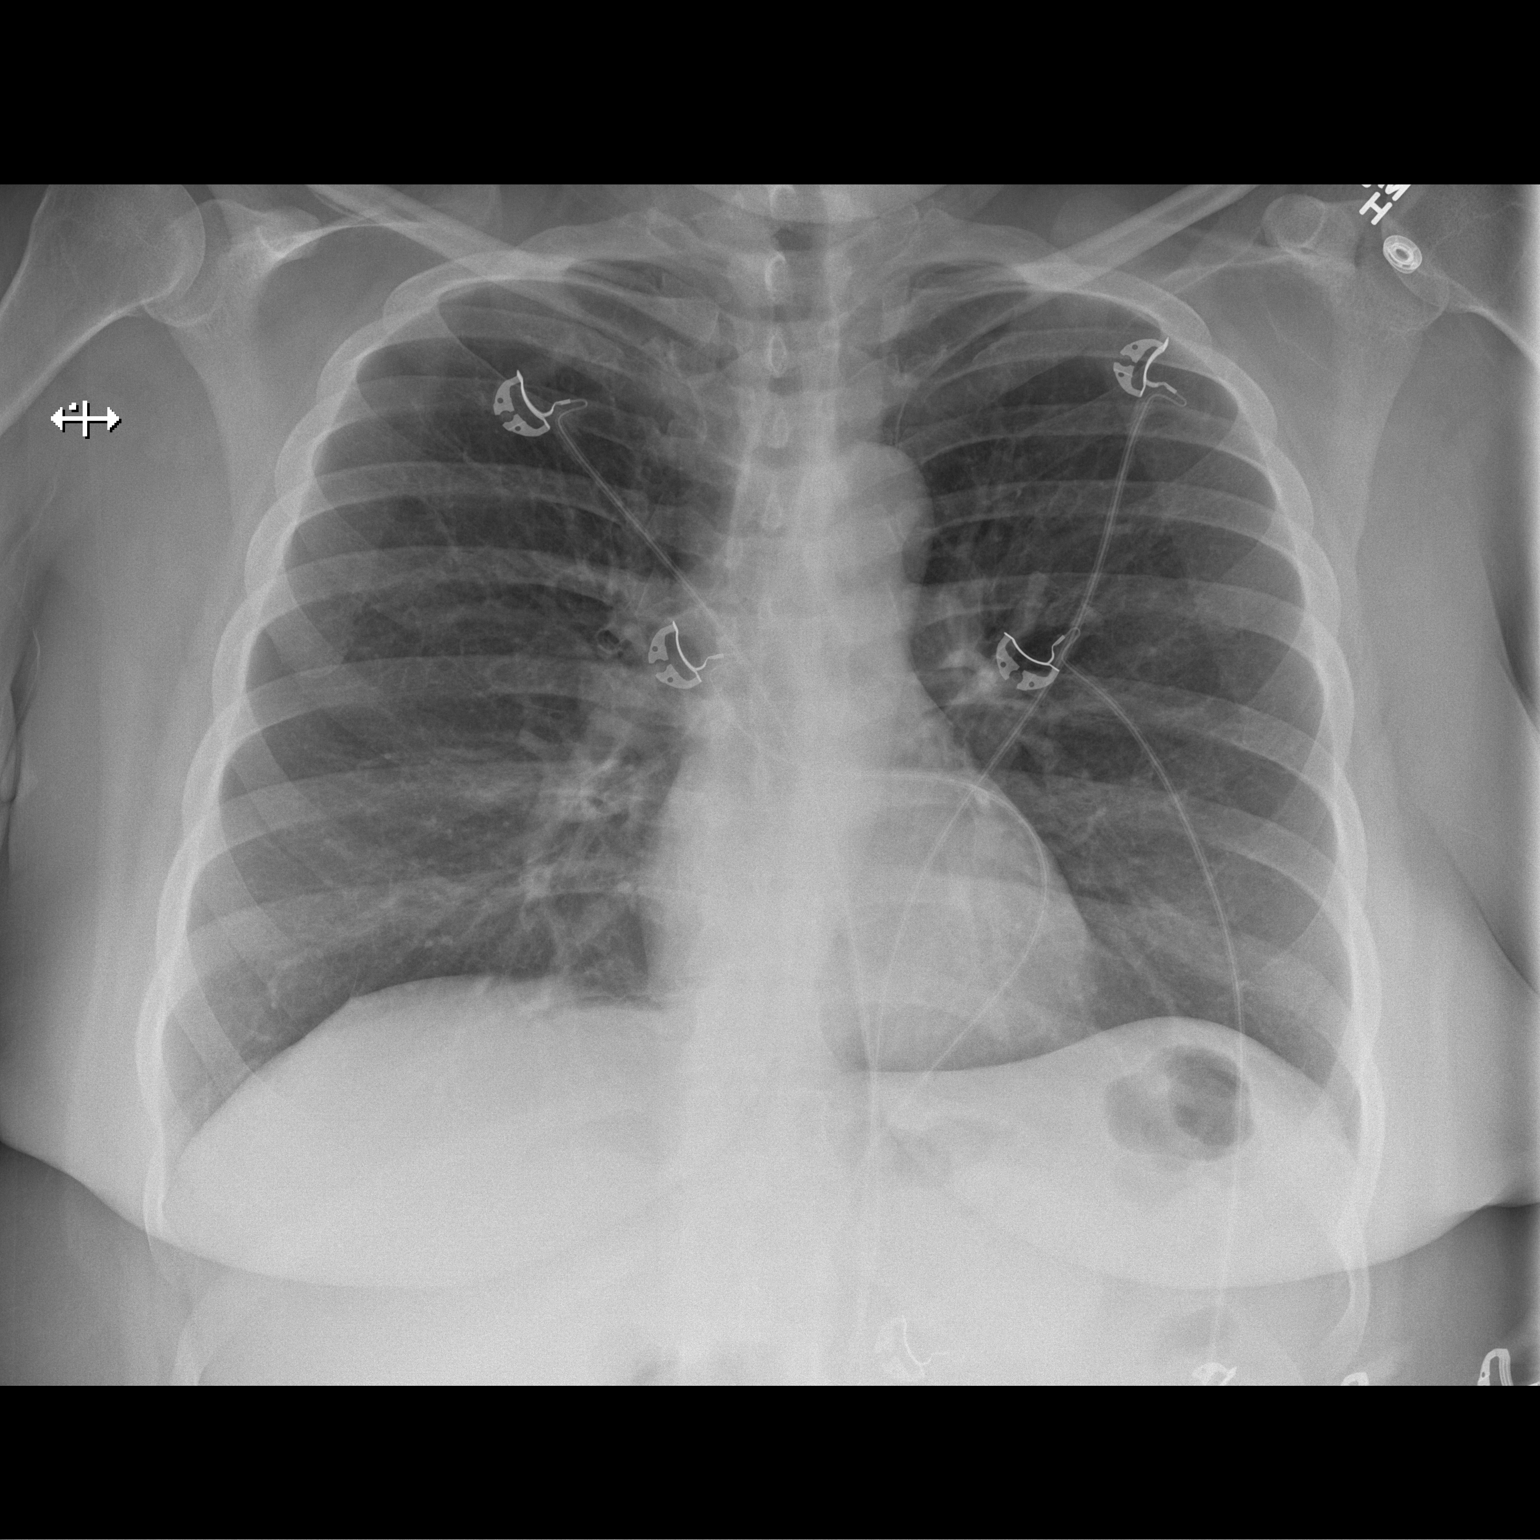

[w chest lat]
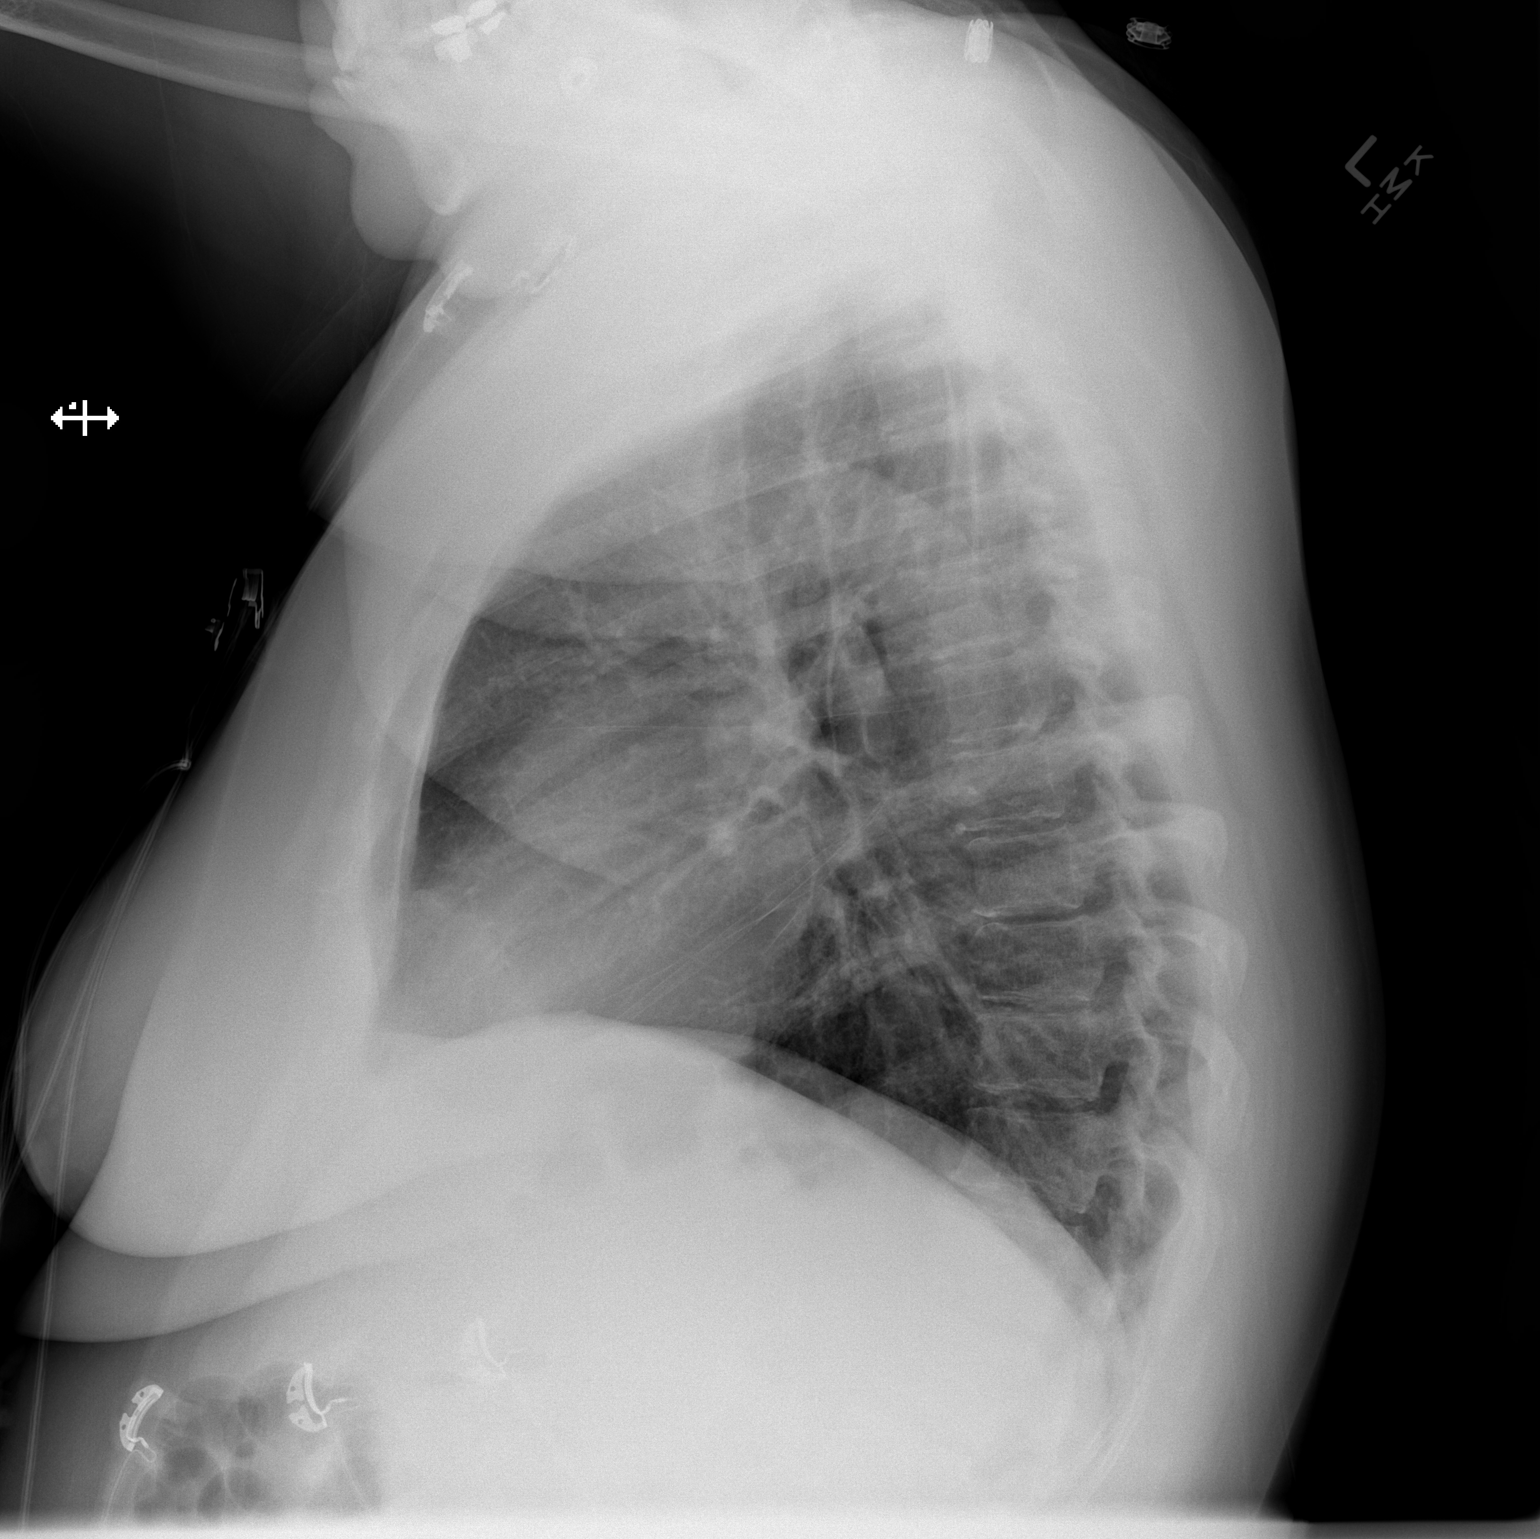

[2 of 2 positions shown; findings below may reference images not displayed]

FINDINGS: The heart size and mediastinal contours are within normal limits.
Both lungs are clear. The visualized skeletal structures are
unremarkable.
IMPRESSION: No active cardiopulmonary disease.
